# Patient Record
Sex: Female | Born: 1980 | Race: Asian | Hispanic: No | Marital: Married
Health system: Southern US, Community
[De-identification: ages and names within clinical notes are randomized; demographics above are authoritative.]

---

## 2008-09-12 ENCOUNTER — Inpatient Hospital Stay (HOSPITAL_COMMUNITY): Admission: AD | Admit: 2008-09-12 | Discharge: 2008-09-12 | Payer: Self-pay | Admitting: Obstetrics & Gynecology

## 2010-03-08 IMAGING — US US OB COMP +14 WK
2 series · 14 of 28 positions shown · non-contrast
Comparison: none

OBSTETRICAL ULTRASOUND:
 This ultrasound exam was performed in the [HOSPITAL] Ultrasound Department.  The OB US report was generated in the AS system, and faxed to the ordering physician.  This report is also available in [REDACTED] PACS.

[Series 1: us ob comp +14 wk · 1 of 6 slices shown (1 of 2)]
[im 3/6]
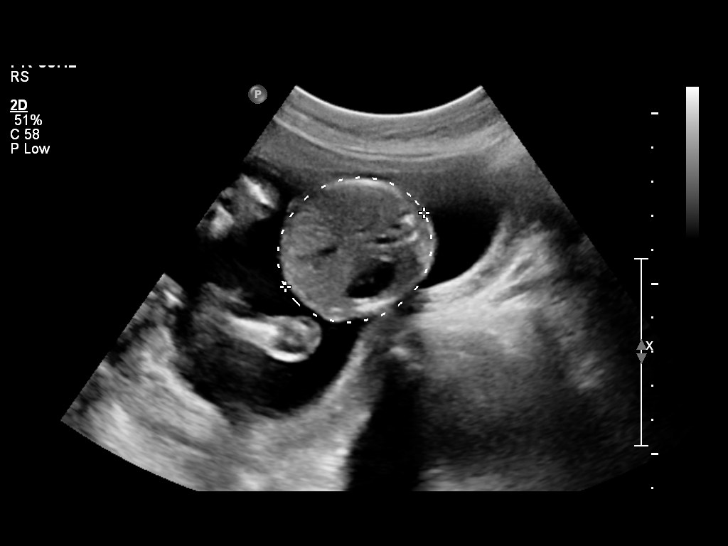

[Series 1: us ob comp +14 wk · 13 of 57 slices shown (2 of 2)]
[im 1/57]
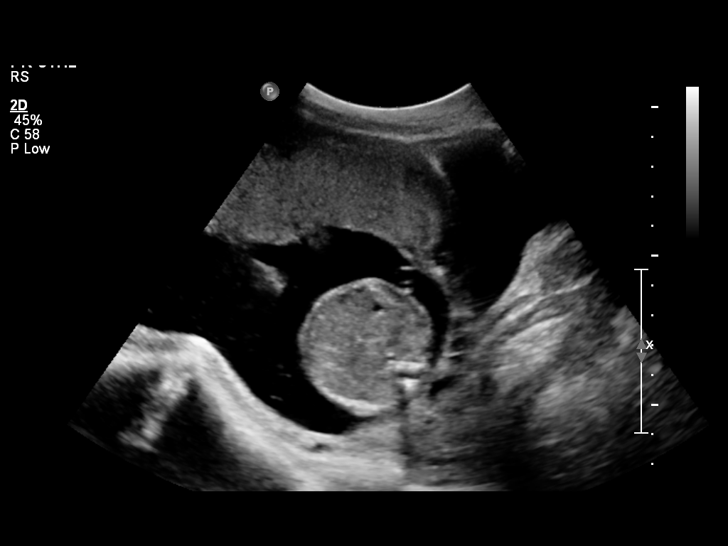
[im 5/57]
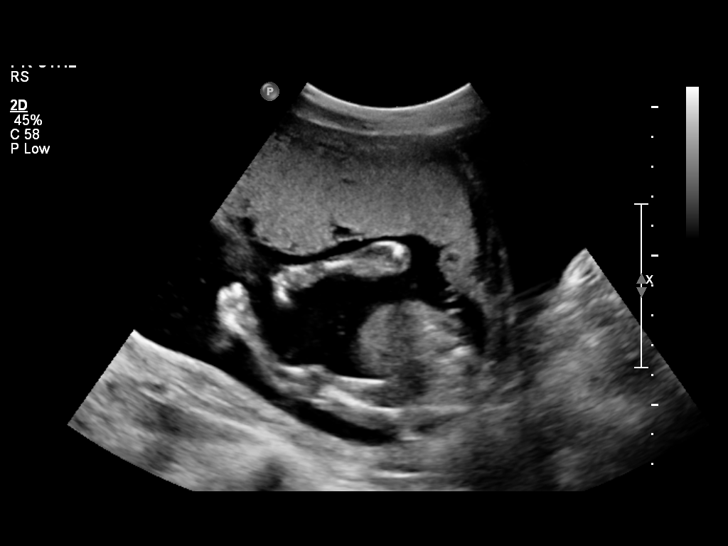
[im 10/57]
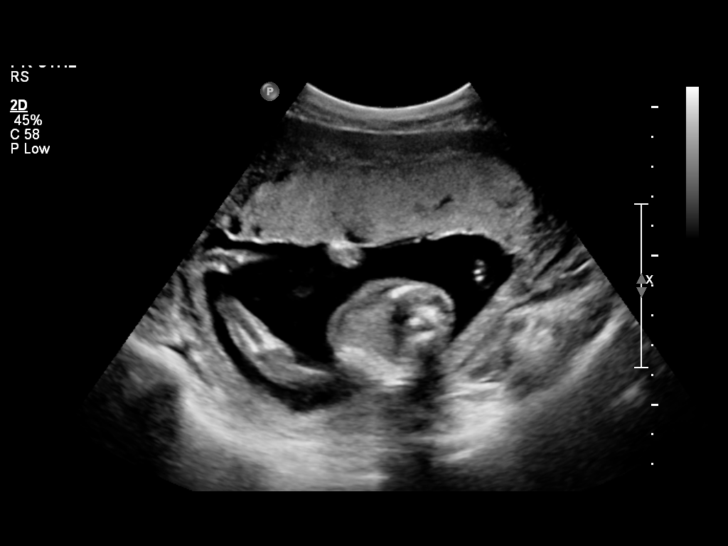
[im 15/57]
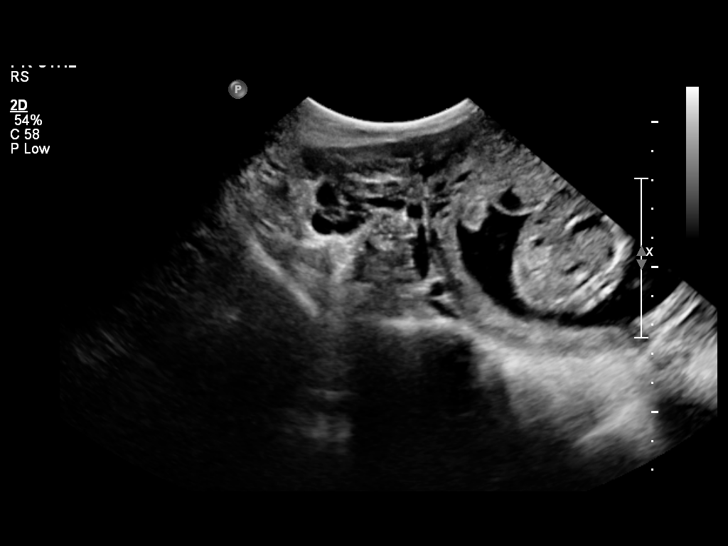
[im 19/57]
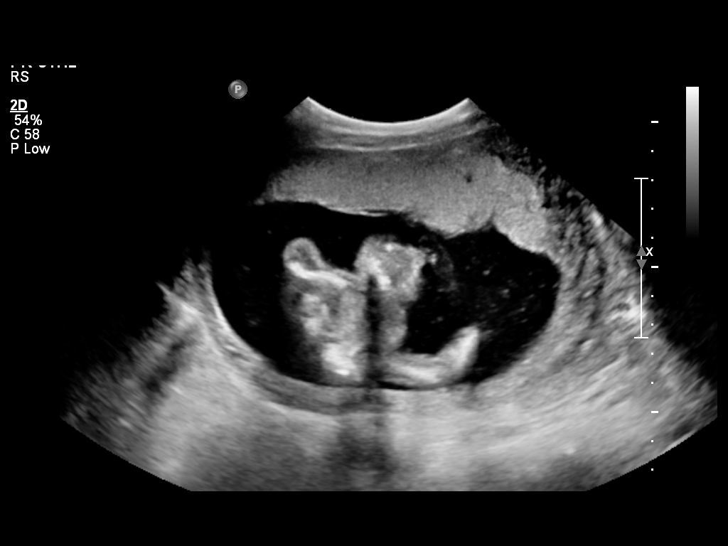
[im 24/57]
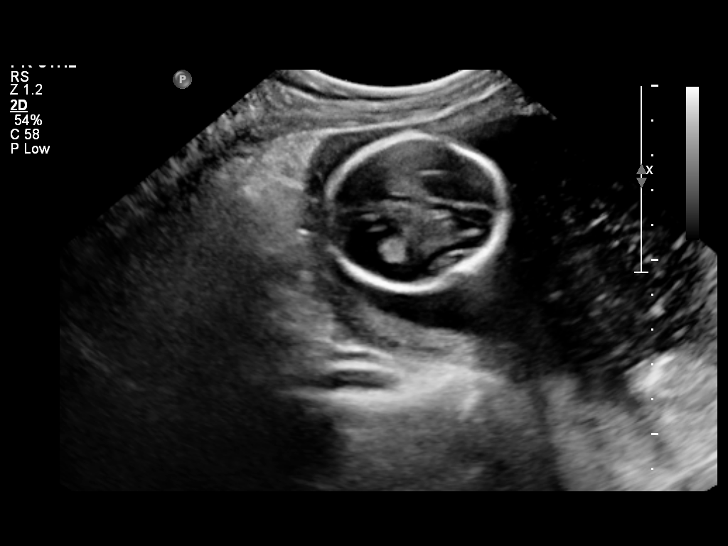
[im 29/57]
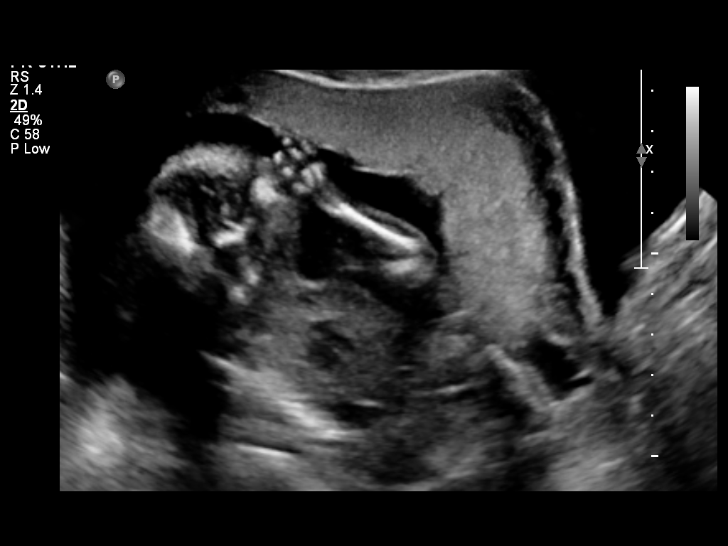
[im 33/57]
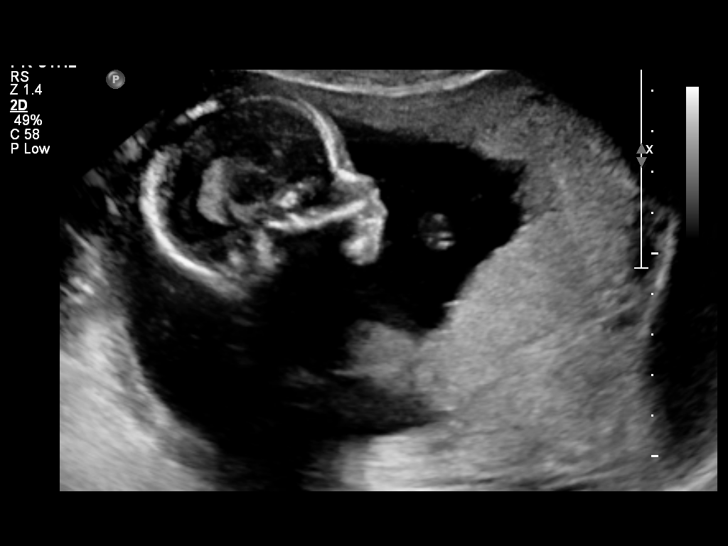
[im 38/57]
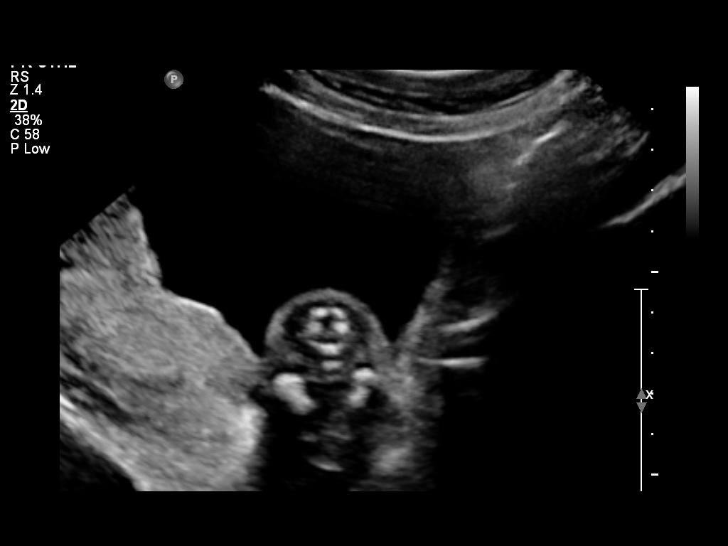
[im 43/57]
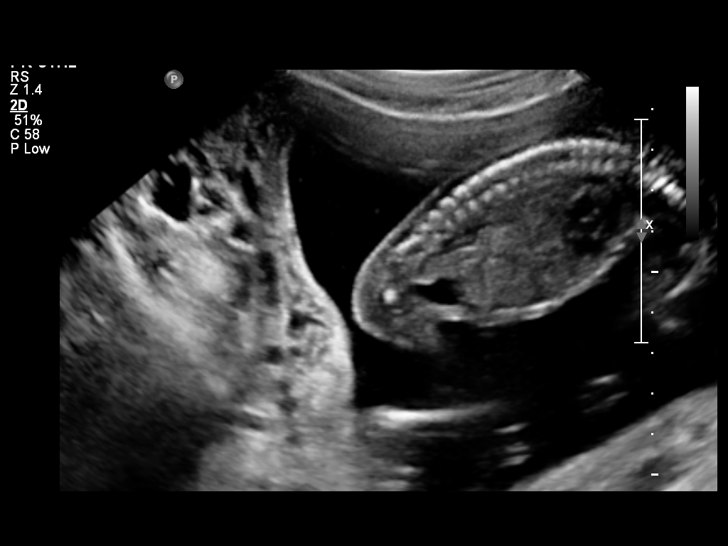
[im 47/57]
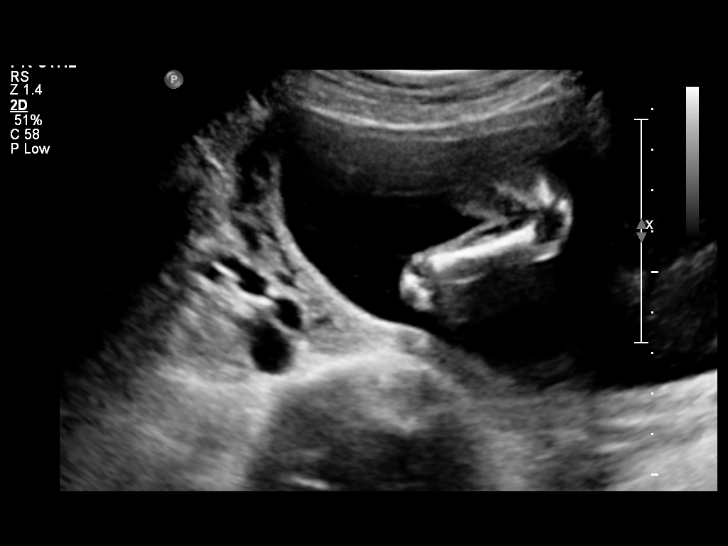
[im 52/57]
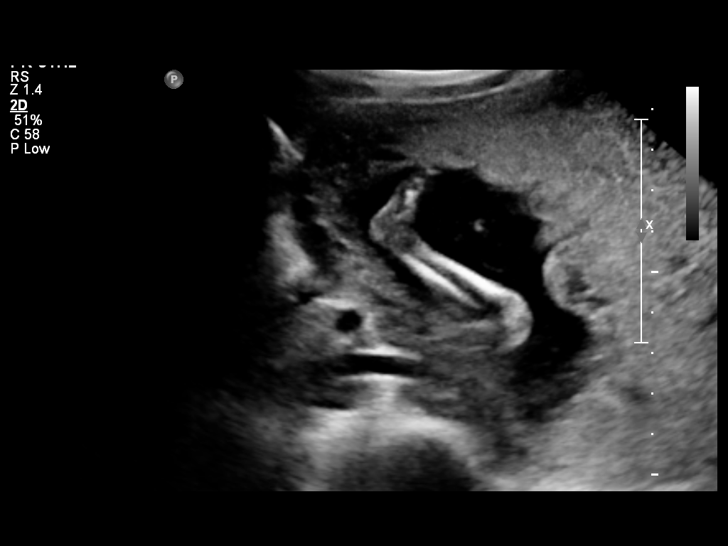
[im 57/57]
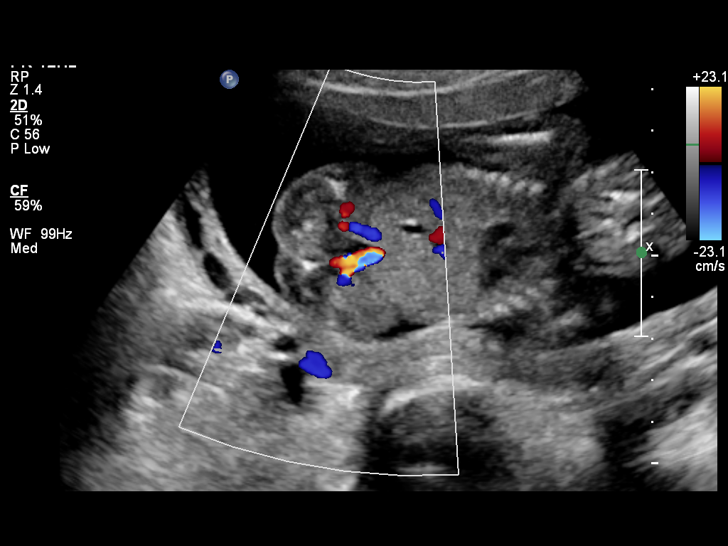

[14 of 28 positions shown; findings below may reference images not displayed]

IMPRESSION: See [facility] Obstetric US report.

## 2010-05-09 LAB — DIFFERENTIAL
Eosinophils Absolute: 0 10*3/uL (ref 0.0–0.7)
Eosinophils Relative: 0 % (ref 0–5)
Lymphocytes Relative: 12 % (ref 12–46)
Lymphs Abs: 1.2 10*3/uL (ref 0.7–4.0)
Monocytes Absolute: 0.4 10*3/uL (ref 0.1–1.0)
Monocytes Relative: 4 % (ref 3–12)

## 2010-05-09 LAB — CBC
HCT: 38.5 % (ref 36.0–46.0)
Hemoglobin: 13.5 g/dL (ref 12.0–15.0)
MCV: 96.4 fL (ref 78.0–100.0)
WBC: 9.9 10*3/uL (ref 4.0–10.5)

## 2016-08-31 ENCOUNTER — Emergency Department (HOSPITAL_COMMUNITY)
Admission: EM | Admit: 2016-08-31 | Discharge: 2016-08-31 | Disposition: A | Discharge status: Home / Self Care | Payer: BLUE CROSS/BLUE SHIELD | Attending: Emergency Medicine | Admitting: Emergency Medicine

## 2016-08-31 DIAGNOSIS — R110 Nausea: Secondary | ICD-10-CM | POA: Insufficient documentation

## 2016-08-31 DIAGNOSIS — E876 Hypokalemia: Secondary | ICD-10-CM | POA: Diagnosis not present

## 2016-08-31 DIAGNOSIS — R5383 Other fatigue: Secondary | ICD-10-CM | POA: Diagnosis not present

## 2016-08-31 DIAGNOSIS — R202 Paresthesia of skin: Secondary | ICD-10-CM | POA: Diagnosis not present

## 2016-08-31 DIAGNOSIS — R200 Anesthesia of skin: Secondary | ICD-10-CM | POA: Diagnosis present

## 2016-08-31 LAB — BASIC METABOLIC PANEL
Anion gap: 11 (ref 5–15)
CALCIUM: 9.5 mg/dL (ref 8.9–10.3)
CHLORIDE: 105 mmol/L (ref 101–111)
CO2: 23 mmol/L (ref 22–32)
CREATININE: 0.61 mg/dL (ref 0.44–1.00)
Glucose, Bld: 110 mg/dL — ABNORMAL HIGH (ref 65–99)
Potassium: 3.3 mmol/L — ABNORMAL LOW (ref 3.5–5.1)
SODIUM: 139 mmol/L (ref 135–145)

## 2016-08-31 LAB — CBC WITH DIFFERENTIAL/PLATELET
BASOS PCT: 0 %
Basophils Absolute: 0 10*3/uL (ref 0.0–0.1)
EOS ABS: 0 10*3/uL (ref 0.0–0.7)
EOS PCT: 1 %
HCT: 44.2 % (ref 36.0–46.0)
HEMOGLOBIN: 15.5 g/dL — AB (ref 12.0–15.0)
Lymphocytes Relative: 34 %
Lymphs Abs: 1.5 10*3/uL (ref 0.7–4.0)
MCH: 30.8 pg (ref 26.0–34.0)
MCHC: 35.1 g/dL (ref 30.0–36.0)
MCV: 87.7 fL (ref 78.0–100.0)
MONOS PCT: 8 %
Monocytes Absolute: 0.4 10*3/uL (ref 0.1–1.0)
NEUTROS PCT: 57 %
Neutro Abs: 2.6 10*3/uL (ref 1.7–7.7)
PLATELETS: 203 10*3/uL (ref 150–400)
RBC: 5.04 MIL/uL (ref 3.87–5.11)
RDW: 12.3 % (ref 11.5–15.5)
WBC: 4.5 10*3/uL (ref 4.0–10.5)

## 2016-08-31 LAB — POC URINE PREG, ED: PREG TEST UR: NEGATIVE

## 2016-08-31 MED ORDER — PANTOPRAZOLE SODIUM 20 MG PO TBEC: 20.0000 mg | DELAYED_RELEASE_TABLET | Freq: Every day | ORAL | 0 refills | Status: AC

## 2016-08-31 NOTE — ED Triage Notes (Signed)
States hands went  Both  Of them started at 3 pm today , states also her feet feel that way also , states feels sluggish  Since  Last week , pt has good grip and can feel me touch , and < 3 sec cap refill has good pulse in both wrist . Feels nauseated  Since last sept.

## 2016-08-31 NOTE — ED Provider Notes (Signed)
[facility]-EMERGENCY DEPT Provider Note   CSN: [id number] Arrival date & time: 08/31/16  1816     History   Chief Complaint Chief Complaint  Patient presents with  . Numbness    HPI Courtney Petersen is a 36 y.o. female.  HPI Patient reports she's had fatigue and sluggish feeling for the last week. Patient reports that she has spent a lot of time just resting in bed. She has not had any documented fever. No generalized myalgia. No headaches. No real localizing symptoms. No chills or night sweats. She does continue to do regular daily activities such as drop her child off at school and pick up. Patient states that she has had a problem with nausea ever since September (almost 11 months now). She reports she feels nauseated but never actually vomits. She denies any abdominal pain. She reports she is able to eat and does not have trouble swallowing. She denies diarrhea. Patient reports a symptom that concerned her today was that she developed tingling sensation in both her hands as well as her feet. This occurred while she was home at rest without exertion. Everything was functional. She was able to pick her child up from school and ambulate normally. No past medical history on file.  There are no active problems to display for this patient.   No past surgical history on file.  OB History    No data available       Home Medications    Prior to Admission medications   Medication Sig Start Date End Date Taking? Authorizing Provider  pantoprazole (PROTONIX) 20 MG tablet Take 1 tablet (20 mg total) by mouth daily. 08/31/16   Arby Barrette, MD    Family History No family history on file.  Social History Social History  Substance Use Topics  . Smoking status: Not on file  . Smokeless tobacco: Not on file  . Alcohol use Not on file     Allergies   Patient has no known allergies.   Review of Systems Review of Systems 10 Systems reviewed and are negative for acute change except as  noted in the HPI.  Physical Exam Updated Vital Signs BP 117/90   Pulse 84   Temp 98.5 F (36.9 C) (Oral)   Resp 14   SpO2 99%   Physical Exam  Constitutional: She is oriented to person, place, and time. She appears well-developed and well-nourished. No distress.  Awake, alert, nontoxic appearance.  HENT:  Head: Normocephalic and atraumatic.  Nose: Nose normal.  Mouth/Throat: Oropharynx is clear and moist. No oropharyngeal exudate.  Eyes: Pupils are equal, round, and reactive to light. Conjunctivae and EOM are normal. Right eye exhibits no discharge. Left eye exhibits no discharge.  Normal consensual pupillary response  Neck: Neck supple. No tracheal deviation present. No thyromegaly present.  Cardiovascular: Normal rate, regular rhythm, normal heart sounds and intact distal pulses.   No murmur heard. Pulmonary/Chest: Effort normal and breath sounds normal. No stridor. No respiratory distress. She has no wheezes. She has no rales. She exhibits no tenderness.  Abdominal: Soft. Bowel sounds are normal. She exhibits no distension and no mass. There is no tenderness. There is no rebound.  Musculoskeletal: Normal range of motion. She exhibits no edema, tenderness or deformity.  Baseline ROM, moves extremities with no obvious new focal weakness.  Lymphadenopathy:    She has no cervical adenopathy.  Neurological: She is alert and oriented to person, place, and time. She has normal strength. She displays normal reflexes. No  cranial nerve deficit or sensory deficit. She exhibits normal muscle tone. Coordination normal. GCS eye subscore is 4. GCS verbal subscore is 5. GCS motor subscore is 6.  Awake, alert, cooperative and aware of situation; motor strength bilaterally; sensation normal to light touch bilaterally; no facial asymmetry; tongue midline; major cranial nerves intact; bilateral biceps reflexes 2+ and symmetric: Bilateral patellar and Achilles reflexes 2+ and symmetric. Patient has  normal sensation to light touch over both lower extremities and upper extremity's. Her moods are coordinated with transition from supine to sitting to standing. No ataxia.  Skin: Skin is warm, dry and intact. No rash noted.  Psychiatric: She has a normal mood and affect.  Nursing note and vitals reviewed.    ED Treatments / Results  Labs (all labs ordered are listed, but only abnormal results are displayed) Labs Reviewed  CBC WITH DIFFERENTIAL/PLATELET - Abnormal; Notable for the following:       Result Value   Hemoglobin 15.5 (*)    All other components within normal limits  BASIC METABOLIC PANEL - Abnormal; Notable for the following:    Potassium 3.3 (*)    Glucose, Bld 110 (*)    BUN <5 (*)    All other components within normal limits  POC URINE PREG, ED  POC URINE PREG, ED    EKG  EKG Interpretation None       Radiology No results found.  Procedures Procedures (including critical care time)  Medications Ordered in ED Medications - No data to display   Initial Impression / Assessment and Plan / ED Course  I have reviewed the triage vital signs and the nursing notes.  Pertinent labs & imaging results that were available during my care of the patient were reviewed by me and considered in my medical decision making (see chart for details).  Clinical Course as of Sep 01 2339  Wed Aug 31, 2016  2236 RBC: 5.04 [MP]    Clinical Course User Index [MP] Arby BarrettePfeiffer, Nuriya Stuck, MD     Final Clinical Impressions(s) / ED Diagnoses   Final diagnoses:  Paresthesias  Nausea  Fatigue, unspecified type  Hypokalemia  Patient is clinically well and appearance. She has normal physical examination with no neurologic deficit. Patient identifies about a week of fatigue. No other localizing symptoms. Consideration is given to viral illness. She however does not have headache, rash or generalized myalgia or arthralgia to suggest arthropod illness. I have very low suspicion for  Guillain-Barr. Patient has brisk reflexes throughout. At this time, I also have low suspicion for multiple sclerosis. Symptoms just started today with no associated weakness or visual dysfunction. Patient has had vague symptoms suggestive of possible viral illness this week. She has been instructed on returning should her symptoms become any more pronounced in terms of developing actual weakness or increasing perception of numbness or tingling.   Patient does report nearly daily problems with nausea for almost a year with no vomiting, diarrhea or pain. I will have her start protonix daily for suspected reflux. Patient has very mild hypokalemia and hemoconcentration. I have counseled her on increased electrolyte and fluid intake. Structures are also given in the discharge instructions to further give information. Patient is advised to follow-up with a primary care provider for recheck within approximately a week. Counseled on return symptoms for which to come back to the emergency department.  New Prescriptions New Prescriptions   PANTOPRAZOLE (PROTONIX) 20 MG TABLET    Take 1 tablet (20 mg total) by mouth  daily.     Arby BarrettePfeiffer, Oluwatimileyin Vivier, MD 08/31/16 [phone]
